# Patient Record
Sex: Female | Born: 1970 | Race: Black or African American | Hispanic: No | Marital: Single
Health system: Southern US, Community
[De-identification: ages and names within clinical notes are randomized; demographics above are authoritative.]

## PROBLEM LIST (undated history)

## (undated) DIAGNOSIS — M1990 Unspecified osteoarthritis, unspecified site: Secondary | ICD-10-CM

## (undated) DIAGNOSIS — I10 Essential (primary) hypertension: Secondary | ICD-10-CM

---

## 1999-10-07 ENCOUNTER — Other Ambulatory Visit: Admission: RE | Admit: 1999-10-07 | Discharge: 1999-10-07 | Payer: Self-pay | Admitting: Family Medicine

## 2008-10-28 ENCOUNTER — Emergency Department (HOSPITAL_COMMUNITY): Admission: EM | Admit: 2008-10-28 | Discharge: 2008-10-28 | Payer: Self-pay | Admitting: Family Medicine

## 2011-01-23 LAB — URINE CULTURE

## 2011-01-23 LAB — POCT URINALYSIS DIP (DEVICE)
Protein, ur: >=300 mg/dL — AB
Specific Gravity, Urine: 1.01 (ref 1.005–1.030)
Urobilinogen, UA: 0.2 mg/dL (ref 0.0–1.0)

## 2011-01-23 LAB — POCT PREGNANCY, URINE: Preg Test, Ur: NEGATIVE

## 2011-02-23 ENCOUNTER — Ambulatory Visit
Admission: RE | Admit: 2011-02-23 | Discharge: 2011-02-23 | Disposition: A | Discharge status: Home / Self Care | Payer: Private Health Insurance - Indemnity | Source: Ambulatory Visit | Attending: Family Medicine | Admitting: Family Medicine

## 2011-02-23 ENCOUNTER — Other Ambulatory Visit: Payer: Self-pay | Admitting: Family Medicine

## 2011-02-23 MED ORDER — IOHEXOL 300 MG/ML  SOLN
100.0000 mL | Freq: Once | INTRAMUSCULAR | Status: AC | PRN
  Administered 2011-02-23: 100 mL via INTRAVENOUS

## 2011-09-07 ENCOUNTER — Other Ambulatory Visit (HOSPITAL_COMMUNITY)
Admission: RE | Admit: 2011-09-07 | Discharge: 2011-09-07 | Disposition: A | Discharge status: Home / Self Care | Payer: Private Health Insurance - Indemnity | Source: Ambulatory Visit | Attending: Family Medicine | Admitting: Family Medicine

## 2011-09-07 DIAGNOSIS — R87810 Cervical high risk human papillomavirus (HPV) DNA test positive: Secondary | ICD-10-CM | POA: Insufficient documentation

## 2011-09-07 DIAGNOSIS — Z124 Encounter for screening for malignant neoplasm of cervix: Secondary | ICD-10-CM | POA: Insufficient documentation

## 2012-10-01 IMAGING — CT CT ABD-PELV W/ CM
2 of 5 series · 17 of 46 positions shown, 19 images · IV contrast (30CC OMNI 300 & [ID] OMNI 300)
Comparison: None.

CLINICAL DATA: Mid and upper abdominal pain for 4 days.  Question
appendicitis.  History of hernia repair as an infant

CT ABDOMEN AND PELVIS WITH CONTRAST
TECHNIQUE: Multidetector CT imaging of the abdomen and pelvis was
performed following the standard protocol during bolus
administration of intravenous contrast.
Contrast: 100 ml Lmnipaque-4YY intravenously.

[Series 2: abdomen w/ · axial · 0.67mm/px · z∈[-411,-56]mm · 14 of 77 slices shown, 16 images]
[im 4/77  soft-tissue]
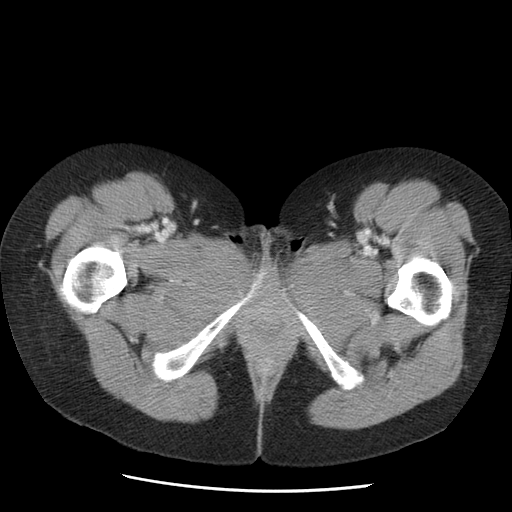
[im 4/77  bone]
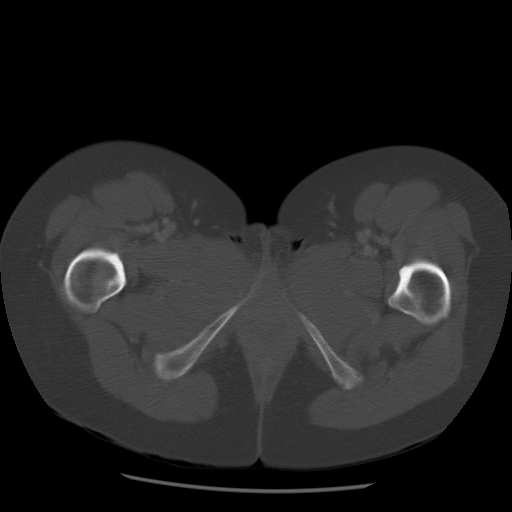
[im 12/77  soft-tissue]
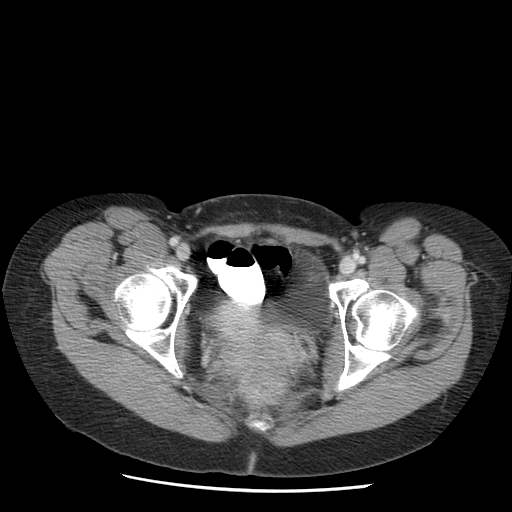
[im 16/77  soft-tissue]
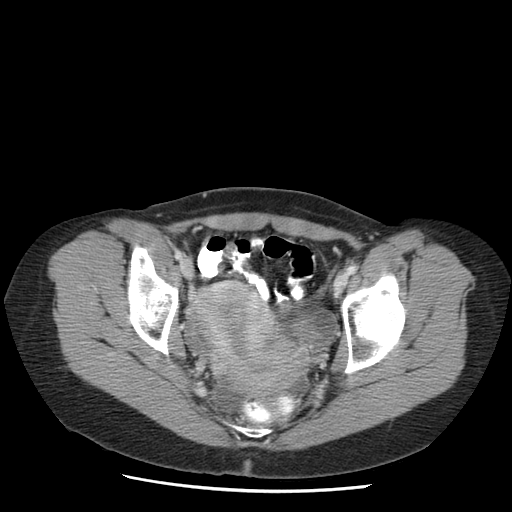
[im 20/77  soft-tissue]
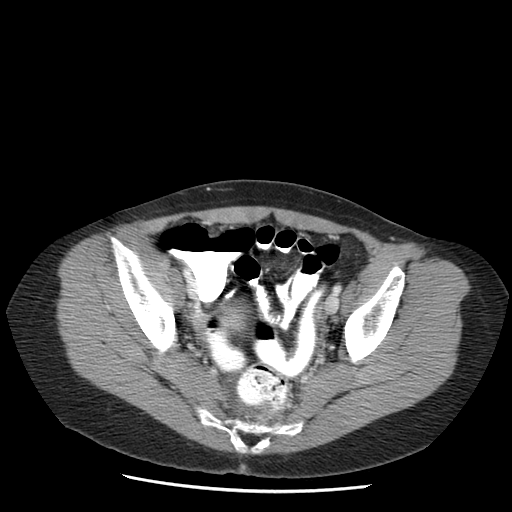
[im 27/77  soft-tissue]
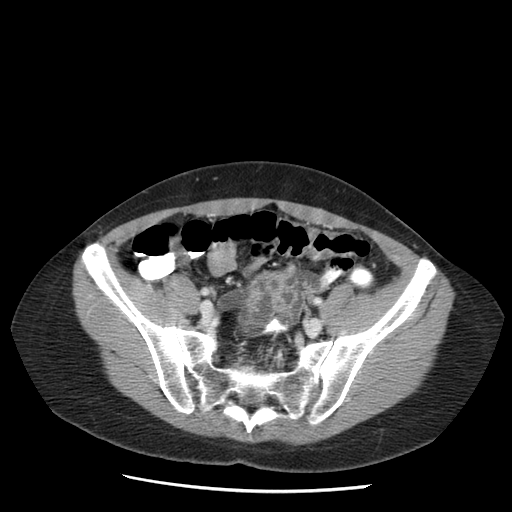
[im 31/77  soft-tissue]
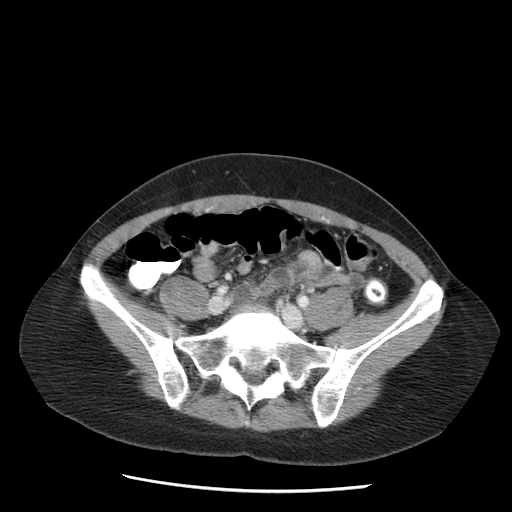
[im 35/77  soft-tissue]
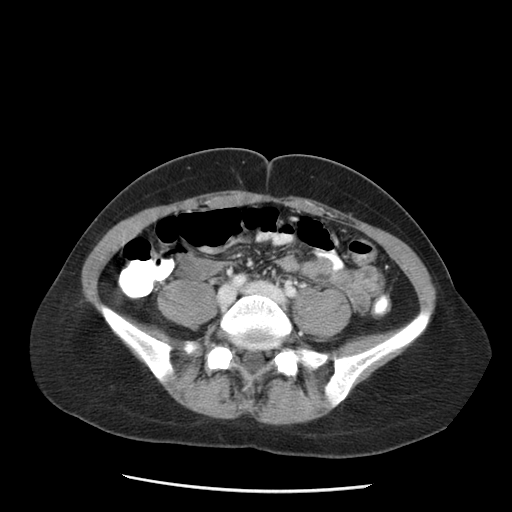
[im 42/77  soft-tissue]
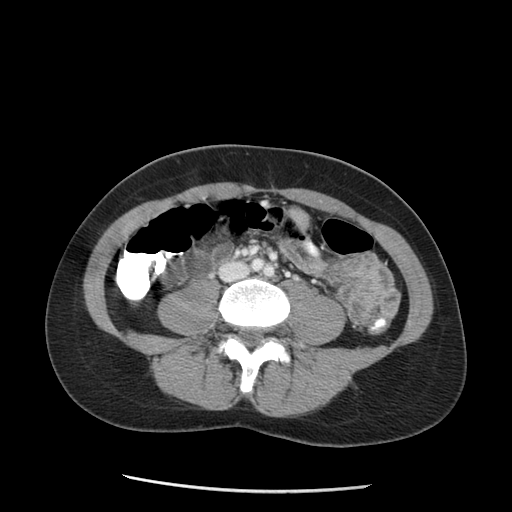
[im 46/77  soft-tissue]
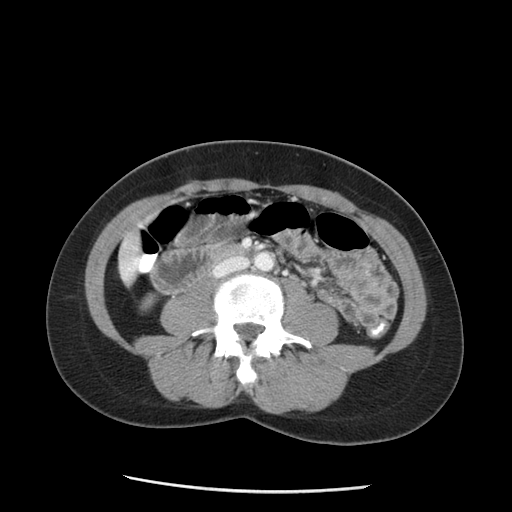
[im 46/77  bone]
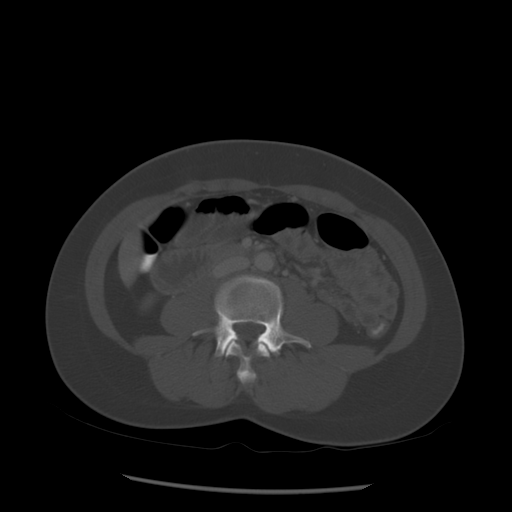
[im 50/77  soft-tissue]
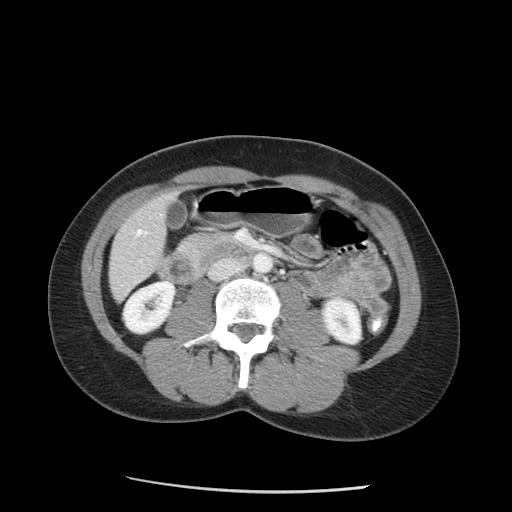
[im 58/77  soft-tissue]
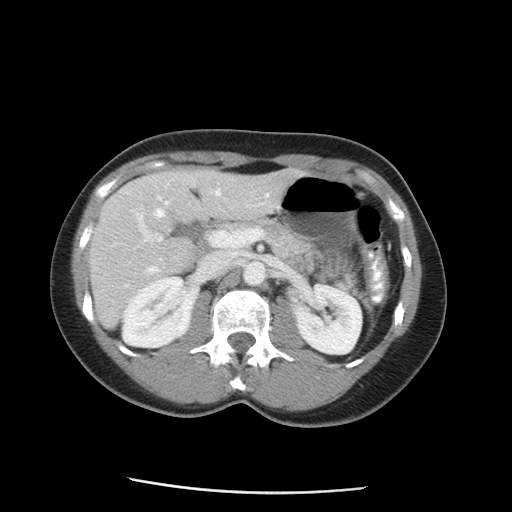
[im 61/77  soft-tissue]
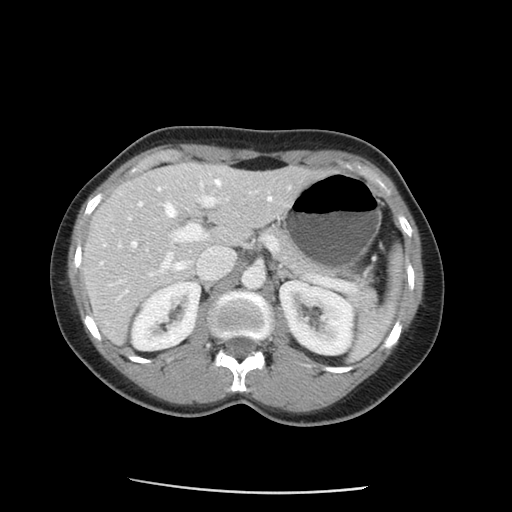
[im 65/77  soft-tissue]
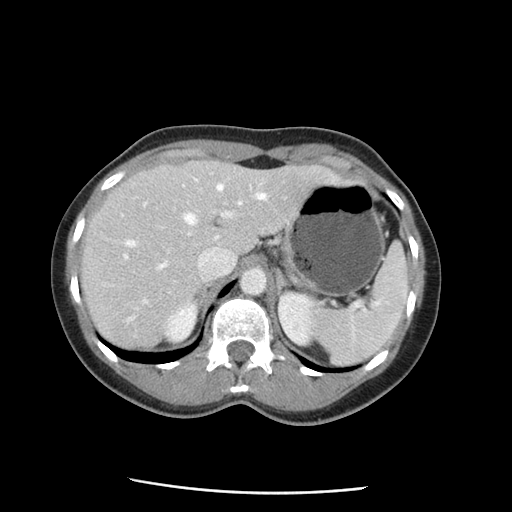
[im 73/77  soft-tissue]
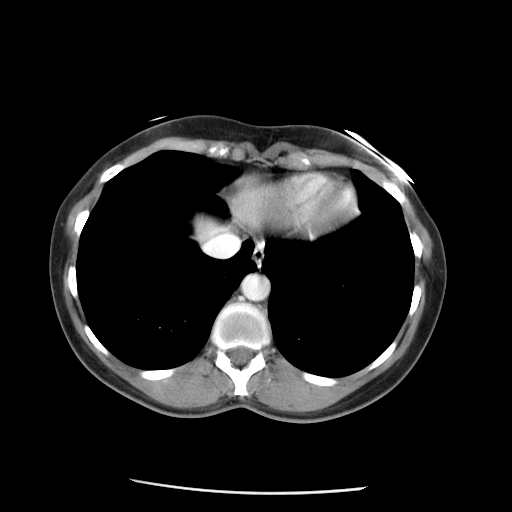

[Series 400: coronals · coronal · 0.78mm/px · 3 of 101 slices shown]
[im 34/101  soft-tissue]
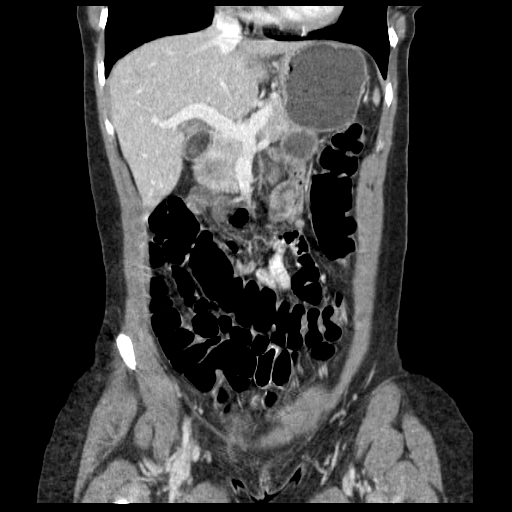
[im 45/101  soft-tissue]
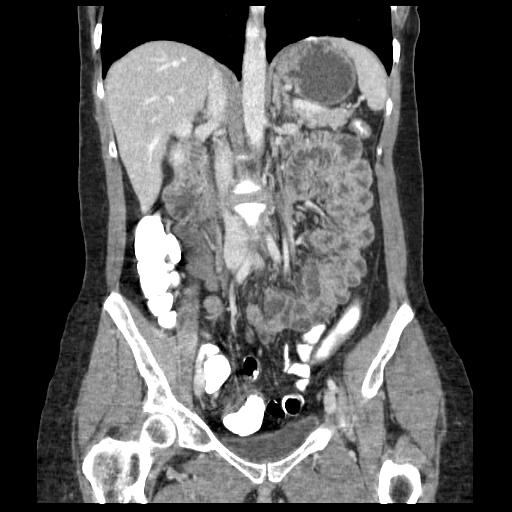
[im 56/101  soft-tissue]
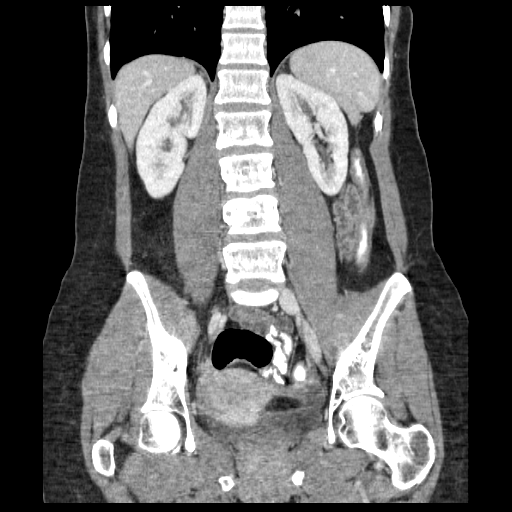

[17 of 46 positions shown; findings below may reference images not displayed]

FINDINGS: The lung bases are clear.  There is no pleural effusion.
There is a 4 mm low density lesion inferiorly in the right hepatic
lobe on image 25.  This is not as well seen post contrast and may
be enhancing, suggesting a possible hemangioma.  The liver
otherwise appears normal.  The spleen, gallbladder, pancreas,
adrenal glands and kidneys appear normal.

The bowel gas pattern is normal.  The appendix appears normal.
There is no evidence of bowel obstruction.

There is a moderate amount of free pelvic fluid. There is a 1.5 cm
fatty lesion in the right adnexa on image 60, likely representing a
small incidental dermoid.  There is no other adnexal abnormality.
The uterus and urinary bladder appear unremarkable.  No osseous
abnormalities are seen.
IMPRESSION: 1.  No evidence of acute appendicitis, pelvic abscess or bowel
obstruction.
2.  Moderate free pelvic fluid is nonspecific but suggestive of a
possible ruptured ovarian cyst.  Correlate clinically.  There is a
probable small incidental right ovarian dermoid.
3.  Small liver lesion, likely incidental.

## 2013-06-10 ENCOUNTER — Other Ambulatory Visit: Payer: Self-pay | Admitting: Family Medicine

## 2013-06-10 ENCOUNTER — Other Ambulatory Visit (HOSPITAL_COMMUNITY)
Admission: RE | Admit: 2013-06-10 | Discharge: 2013-06-10 | Disposition: A | Discharge status: Home / Self Care | Payer: Private Health Insurance - Indemnity | Source: Ambulatory Visit | Attending: Family Medicine | Admitting: Family Medicine

## 2013-06-10 DIAGNOSIS — Z124 Encounter for screening for malignant neoplasm of cervix: Secondary | ICD-10-CM | POA: Insufficient documentation

## 2013-06-10 DIAGNOSIS — Z113 Encounter for screening for infections with a predominantly sexual mode of transmission: Secondary | ICD-10-CM | POA: Insufficient documentation

## 2013-06-10 DIAGNOSIS — Z1151 Encounter for screening for human papillomavirus (HPV): Secondary | ICD-10-CM | POA: Insufficient documentation

## 2017-12-18 ENCOUNTER — Other Ambulatory Visit: Payer: Self-pay

## 2017-12-18 ENCOUNTER — Emergency Department (HOSPITAL_COMMUNITY)
Admission: EM | Admit: 2017-12-18 | Discharge: 2017-12-19 | Disposition: A | Discharge status: Home / Self Care | Payer: 59 | Attending: Emergency Medicine | Admitting: Emergency Medicine

## 2017-12-18 ENCOUNTER — Encounter (HOSPITAL_COMMUNITY): Payer: Self-pay | Admitting: Emergency Medicine

## 2017-12-18 DIAGNOSIS — I10 Essential (primary) hypertension: Secondary | ICD-10-CM | POA: Diagnosis not present

## 2017-12-18 DIAGNOSIS — M79605 Pain in left leg: Secondary | ICD-10-CM | POA: Diagnosis not present

## 2017-12-18 DIAGNOSIS — M5410 Radiculopathy, site unspecified: Secondary | ICD-10-CM | POA: Insufficient documentation

## 2017-12-18 DIAGNOSIS — R030 Elevated blood-pressure reading, without diagnosis of hypertension: Secondary | ICD-10-CM

## 2017-12-18 DIAGNOSIS — M25562 Pain in left knee: Secondary | ICD-10-CM | POA: Diagnosis not present

## 2017-12-18 DIAGNOSIS — M792 Neuralgia and neuritis, unspecified: Secondary | ICD-10-CM

## 2017-12-18 HISTORY — DX: Essential (primary) hypertension: I10

## 2017-12-18 HISTORY — DX: Unspecified osteoarthritis, unspecified site: M19.90

## 2017-12-18 NOTE — ED Triage Notes (Signed)
PT reports that her blood pressure has been up despite taking her medication.  She also complains of joint pain (both knees and shoulder.)

## 2017-12-19 ENCOUNTER — Ambulatory Visit (HOSPITAL_BASED_OUTPATIENT_CLINIC_OR_DEPARTMENT_OTHER)
Admission: RE | Admit: 2017-12-19 | Discharge: 2017-12-19 | Disposition: A | Discharge status: Home / Self Care | Payer: 59 | Source: Ambulatory Visit | Attending: Emergency Medicine | Admitting: Emergency Medicine

## 2017-12-19 ENCOUNTER — Ambulatory Visit (HOSPITAL_COMMUNITY): Admission: RE | Admit: 2017-12-19 | Payer: 59 | Source: Ambulatory Visit

## 2017-12-19 DIAGNOSIS — M79609 Pain in unspecified limb: Secondary | ICD-10-CM | POA: Diagnosis not present

## 2017-12-19 DIAGNOSIS — M79605 Pain in left leg: Secondary | ICD-10-CM | POA: Insufficient documentation

## 2017-12-19 NOTE — ED Provider Notes (Signed)
[facility] Provider Note   CSN: [id number] Arrival date & time: 12/18/17  2203     History   Chief Complaint Chief Complaint  Patient presents with  . Hypertension  . Joint Pain    HPI Felicia Hickman is a 47 y.o. female.  Patient presents to the ED with a chief complaint of hypertension.  She states that she was traveling and forgot to take her BP meds.  States that it felt like her BP was high and when she took it was significantly elevated when compared to her normal.  She denies any headache, dizziness, chest pain, or shortness of breath.  She reports having some right upper shoulder pain which radiates down the right arm.  This is made worse with palpation and movement. She also complaint of left knee pain and swelling x 3 weeks, but believes this was wors[place]    The history is provided by the patient. No language interpreter was used.    Past Medical History:  Diagnosis Date  . Arthritis   . Hypertension     There are no active problems to display for this patient.   History reviewed. No pertinent surgical history.  OB History    No data available       Home Medications    Prior to Admission medications   Not on File    Family History No family history on file.  Social History Social History   Tobacco Use  . Smoking status: Not on file  Substance Use Topics  . Alcohol use: Not on file  . Drug use: Not on file     Allergies   Flonase [fluticasone propionate]   Review of Systems Review of Systems  All other systems reviewed and are negative.    Physical Exam Updated Vital Signs BP 118/81   Pulse 69   Temp 98.7 F (37.1 C) (Oral)   Resp 18   SpO2 100%   Physical Exam  Constitutional: She is oriented to person, place, and time. She appears well-developed and well-nourished.  HENT:  Head: Normocephalic and atraumatic.  Right Ear: External ear normal.  Left  Ear: External ear normal.  Eyes: Conjunctivae and EOM are normal. Pupils are equal, round, and reactive to light.  Neck: Normal range of motion. Neck supple.  No pain with neck flexion, no meningismus  Cardiovascular: Normal rate, regular rhythm and normal heart sounds. Exam reveals no gallop and no friction rub.  No murmur heard. Pulmonary/Chest: Effort normal and breath sounds normal. No respiratory distress. She has no wheezes. She has no rales. She exhibits no tenderness.  Abdominal: Soft. Bowel sounds are normal. She exhibits no distension and no mass. There is no tenderness. There is no rebound and no guarding.  Musculoskeletal: Normal range of motion. She exhibits no edema or tenderness.  Minimal left calf tenderness Mild swelling of the left knee, ROM and strength intact, no palpable effusion, no cellulitic changes or evidence of abscess  Neurological: She is alert and oriented to person, place, and time. She has normal reflexes.  CN 3-12 intact, normal finger to nose, no pronator drift, sensation and strength intact bilaterally.  Skin: Skin is warm and dry.  Psychiatric: She has a normal mood and affect. Her behavior is normal. Judgment and thought content normal.  Nursing note and vitals reviewed.    ED Treatments / Results  Labs (all labs ordered are listed, but only abnormal results are  displayed) Labs Reviewed - No data to display  EKG  EKG Interpretation None       Radiology No results found.  Procedures Procedures (including critical care time)  Medications Ordered in ED Medications - No data to display   Initial Impression / Assessment and Plan / ED Course  I have reviewed the triage vital signs and the nursing notes.  Pertinent labs & imaging results that were available during my care of the patient were reviewed by me and considered in my medical decision making (see chart for details).     Patient with hypertension.  Has not been 100% compliant with  meds and became concerned about elevated BP.  BP has trended down nicely in the ED as patient has relaxed.  She has no CP, HA, or weakness.  She does have some right shoulder and upper trap pain that radiates to the RUE, but this is reproducible.  She has good strength and sensation and is neurovascularly intact.  She does have some slight left calf tenderness and left knee pain.  I suspect that the pain in her knee is OA as she has a hx of the same in the right and it has been present for 3 weeks,  However, due to the fact that it worsened after the plane ride and she has some calf tenderness will order outpatient doppler DVT study.  Patient will return for this in the morning.  Will give knee sleeve for comfort.  Doubt utility of x-ray in evaluating the knee pain x 3 weeks.  Recommend PCP follow-up.  Final Clinical Impressions(s) / ED Diagnoses   Final diagnoses:  Elevated blood pressure reading  Acute pain of left knee  Radicular pain in right arm    ED Discharge Orders        Ordered    [facility]     12/19/17 0523       Roxy Horseman, PA-C 12/19/17 0525    Dione Booze, MD 12/19/17 [phone]

## 2017-12-19 NOTE — Discharge Instructions (Addendum)
Please take blood pressure medications as directed.  Return for ultrasound as directed.  Return to the ER for chest pain, shortness of breath, or any other symptoms you find concerning.   Please follow-up with your doctor regarding your knee and shoulder pain.

## 2017-12-19 NOTE — ED Notes (Signed)
Ortho in room applying knee sleeve

## 2017-12-19 NOTE — Progress Notes (Signed)
Left lower extremity venous duplex has been completed. Negative for DVT.  12/19/17 3:43 PM Olen CordialGreg Icelyn Navarrete RVT

## 2018-02-05 ENCOUNTER — Other Ambulatory Visit (HOSPITAL_COMMUNITY)
Admission: RE | Admit: 2018-02-05 | Discharge: 2018-02-05 | Disposition: A | Discharge status: Home / Self Care | Payer: 59 | Source: Ambulatory Visit | Attending: Family Medicine | Admitting: Family Medicine

## 2018-02-05 ENCOUNTER — Other Ambulatory Visit: Payer: Self-pay | Admitting: Family Medicine

## 2018-02-05 DIAGNOSIS — Z01411 Encounter for gynecological examination (general) (routine) with abnormal findings: Secondary | ICD-10-CM | POA: Insufficient documentation

## 2018-02-08 LAB — CYTOLOGY - PAP
Diagnosis: NEGATIVE
HPV (WINDOPATH): NOT DETECTED

## 2018-05-01 ENCOUNTER — Other Ambulatory Visit (HOSPITAL_BASED_OUTPATIENT_CLINIC_OR_DEPARTMENT_OTHER): Payer: Self-pay

## 2018-05-01 DIAGNOSIS — R5383 Other fatigue: Secondary | ICD-10-CM

## 2018-05-01 DIAGNOSIS — G4710 Hypersomnia, unspecified: Secondary | ICD-10-CM

## 2018-06-03 ENCOUNTER — Ambulatory Visit (HOSPITAL_BASED_OUTPATIENT_CLINIC_OR_DEPARTMENT_OTHER): Payer: 59 | Attending: Internal Medicine | Admitting: Internal Medicine

## 2018-06-03 VITALS — Ht 63.5 in | Wt 150.0 lb

## 2018-06-03 DIAGNOSIS — R5383 Other fatigue: Secondary | ICD-10-CM

## 2018-06-03 DIAGNOSIS — G4710 Hypersomnia, unspecified: Secondary | ICD-10-CM | POA: Diagnosis not present

## 2018-06-04 ENCOUNTER — Ambulatory Visit (HOSPITAL_BASED_OUTPATIENT_CLINIC_OR_DEPARTMENT_OTHER): Payer: Self-pay | Attending: Internal Medicine | Admitting: Internal Medicine

## 2018-06-04 DIAGNOSIS — G4719 Other hypersomnia: Secondary | ICD-10-CM

## 2018-06-05 ENCOUNTER — Other Ambulatory Visit (HOSPITAL_BASED_OUTPATIENT_CLINIC_OR_DEPARTMENT_OTHER): Payer: Self-pay

## 2018-06-05 DIAGNOSIS — R5383 Other fatigue: Secondary | ICD-10-CM

## 2018-06-05 DIAGNOSIS — G4710 Hypersomnia, unspecified: Secondary | ICD-10-CM

## 2018-06-15 NOTE — Procedures (Signed)
   NAME: Felicia Hickman DATE OF BIRTH:  [birth year] MEDICAL RECORD NUMBER [id number]  LOCATION: [facility]  PHYSICIAN: Deretha Emory  DATE OF STUDY: 06/03/2018  SLEEP STUDY TYPE: Nocturnal Polysomnogram               REFERRING PHYSICIAN: Deretha Emory, MD  INDICATION FOR STUDY: excessive daytime sleepiness  EPWORTH SLEEPINESS SCORE:  7 FOSQ 10: 10.6 HEIGHT: 5' 3.5" (161.3 cm)  WEIGHT: 150 lb (68 kg)    Body mass index is 26.15 kg/m.  NECK SIZE: 13.5 in.  MEDICATIONS  Patient self administered medications include: N/A. Medications administered during study include No sleep medicine administered.Marland Kitchen   SLEEP STUDY TECHNIQUE  A multi-channel overnight Polysomnography study was performed. The channels recorded and monitored were central and occipital EEG, electrooculogram (EOG), submentalis EMG (chin), nasal and oral airflow, thoracic and abdominal wall motion, anterior tibialis EMG, snore microphone, electrocardiogram, and a pulse oximetry.   TECHNICAL COMMENTS  Comments added by Technician: PT HAD ONE REST ROOM VISTED. Patient had difficulty initiating sleep. Patient was restless all through the night. Comments added by Scorer: N/A   SLEEP ARCHITECTURE  The study was initiated at 10:53:32 PM and terminated at 5:58:55 AM. The total recorded time was 425.4 minutes. EEG confirmed total sleep time was 240 minutes yielding a sleep efficiency of 56.4%%. Sleep onset after lights out was 70.3 minutes with a REM latency of 141.5 minutes. The patient spent 4.6%% of the night in stage N1 sleep, 53.8%% in stage N2 sleep, 24.6%% in stage N3 and 17.1% in REM. Wake after sleep onset (WASO) was 115.1 minutes. The Arousal Index was 7.8/hour.   RESPIRATORY PARAMETERS  There were a total of 2 respiratory disturbances out of which 1 were apneas ( 0 obstructive, 0 mixed, 1 central) and 1 hypopneas. The apnea/hypopnea index (AHI) was 0.5 events/hour. The central sleep apnea  index was 0.3 events/hour. The REM AHI was 1.5 events/hour and NREM AHI was 0.3 events/hour. The supine AHI was 0.8 events/hour and the non supine AHI was 0.4 supine during 31.36% of sleep. Respiratory disturbances were associated with oxygen desaturation down to a nadir of 93.0% during sleep. The mean oxygen saturation during the study was 96.3%. The cumulative time under 88% oxygen saturation was 5.5 minutes.   LEG MOVEMENT DATA  The total leg movements were 0 with a resulting leg movement index of 0.0/hr . Associated arousal with leg movement index was 0.0/hr.   CARDIAC DATA  The underlying cardiac rhythm was most consistent with sinus rhythm. Mean heart rate during sleep was 61.3 bpm. Additional rhythm abnormalities include None.   IMPRESSIONS  Decreased sleep efficiency No sleep apnea or leg movements causing arousals.   DIAGNOSIS  Excessive daytime sleepiness   RECOMMENDATIONS  May proceed with MSLT  Deretha Emory Sleep specialist, American Board of Internal Medicine  ELECTRONICALLY SIGNED ON:  06/15/2018, 7:57 PM [facility] PH: [phone]   FX: [phone] ACCREDITED BY THE AMERICAN ACADEMY OF SLEEP MEDICINE

## 2018-06-18 NOTE — Procedures (Signed)
    NAME: Felicia Hickman DATE OF BIRTH:  Sep 20, 1971 MEDICAL RECORD NUMBER [id number]  LOCATION: [facility]  PHYSICIAN: Deretha Emory  DATE OF STUDY: 06/04/2018  SLEEP STUDY TYPE: Multiple Sleep Latency Test               REFERRING PHYSICIAN: Deretha Emory, MD  INDICATION FOR STUDY: excessive daytime sleepiness in patient with normal PSG.  EPWORTH SLEEPINESS SCORE:   see PSG  HEIGHT:    WEIGHT:      There is no height or weight on file to calculate BMI.   MEDICATIONS  Patient self administered medications include: N/A. Medications administered during study include No sleep medicine administered.Marland Kitchen   SLEEP STUDY TECHNIQUE  A multiple sleep latency test was performed. The channels recorded and monitored were central and occipital EEG, electrooculogram (EOG), submentalis EMG (chin), and electrocardiogram.   TECHNICAL COMMENTS  Comments added by Technician: NONE Comments added by Scorer: N/A   IMPRESSIONS  Total number of naps attempted: 5 . Total number of naps with sleep attained: 3 No sleep onset REMs present. This study does not suggest narcolepsy Sleep onset latency 12:29 minutes. This test does not suggest pathologic sleepiness.  DIAGNOSIS  Normal study  RECOMMENDATIONS  Return for follow up to evaluate other causes of excessive daytime sleepiness.  Deretha Emory Sleep specialist, American Board of Internal Medicine  ELECTRONICALLY SIGNED ON:  06/18/2018, 9:30 PM [facility] PH: [phone]   FX: [phone] ACCREDITED BY THE AMERICAN ACADEMY OF SLEEP MEDICINE

## 2020-05-28 DIAGNOSIS — Z1231 Encounter for screening mammogram for malignant neoplasm of breast: Secondary | ICD-10-CM | POA: Diagnosis not present

## 2020-09-16 ENCOUNTER — Ambulatory Visit: Payer: Self-pay | Attending: Internal Medicine

## 2020-09-16 DIAGNOSIS — Z23 Encounter for immunization: Secondary | ICD-10-CM

## 2020-09-16 NOTE — Progress Notes (Signed)
   Covid-19 Vaccination Clinic  Name:  KYLEI PURINGTON    MRN: [id number] DOB: 1971-01-16  09/16/2020  Ms. Afshar was observed post Covid-19 immunization for 15 minutes without incident. She was provided with Vaccine Information Sheet and instruction to access the V-Safe system.   Ms. Haberl was instructed to call 911 with any severe reactions post vaccine: Marland Kitchen Difficulty breathing  . Swelling of face and throat  . A fast heartbeat  . A bad rash all over body  . Dizziness and weakness   Immunizations Administered    Name Date Dose VIS Date Route   Pfizer COVID-19 Vaccine 09/16/2020  5:39 PM 0.3 mL 07/28/2020 Intramuscular   Manufacturer: [employer], [employer]   Lot: [id number]   NDC: [address]-1

## 2021-04-22 ENCOUNTER — Ambulatory Visit: Payer: Self-pay | Attending: Internal Medicine

## 2021-04-22 ENCOUNTER — Other Ambulatory Visit (HOSPITAL_BASED_OUTPATIENT_CLINIC_OR_DEPARTMENT_OTHER): Payer: Self-pay

## 2021-04-22 ENCOUNTER — Other Ambulatory Visit: Payer: Self-pay

## 2021-04-22 DIAGNOSIS — Z23 Encounter for immunization: Secondary | ICD-10-CM

## 2021-04-22 MED ORDER — PFIZER-BIONT COVID-19 VAC-TRIS 30 MCG/0.3ML IM SUSP
INTRAMUSCULAR | 0 refills | Status: AC
  Filled 2021-04-22: qty 0.3, 1d supply, fill #0

## 2021-04-22 NOTE — Progress Notes (Signed)
   Covid-19 Vaccination Clinic  Name:  Felicia Hickman    MRN: [id number] DOB: 1971-03-12  04/22/2021  [name] was observed post Covid-19 immunization for 15 minutes without incident. She was provided with Vaccine Information Sheet and instruction to access the V-Safe system.   [name] was instructed to call 911 with any severe reactions post vaccine: Difficulty breathing  Swelling of face and throat  A fast heartbeat  A bad rash all over body  Dizziness and weakness   Immunizations Administered     Name Date Dose VIS Date Route   PFIZER Comrnaty(Gray TOP) Covid-19 Vaccine 04/22/2021 11:50 AM 0.3 mL 09/16/2020 Intramuscular   Manufacturer: [employer], [employer]   Lot: [id number]   NDC: [phone]

## 2021-06-03 DIAGNOSIS — Z1231 Encounter for screening mammogram for malignant neoplasm of breast: Secondary | ICD-10-CM | POA: Diagnosis not present

## 2021-08-12 DIAGNOSIS — I10 Essential (primary) hypertension: Secondary | ICD-10-CM | POA: Diagnosis not present

## 2021-08-12 DIAGNOSIS — R5383 Other fatigue: Secondary | ICD-10-CM | POA: Diagnosis not present

## 2021-12-01 ENCOUNTER — Ambulatory Visit: Payer: Self-pay | Attending: Internal Medicine

## 2021-12-01 ENCOUNTER — Other Ambulatory Visit (HOSPITAL_BASED_OUTPATIENT_CLINIC_OR_DEPARTMENT_OTHER): Payer: Self-pay

## 2021-12-01 DIAGNOSIS — Z23 Encounter for immunization: Secondary | ICD-10-CM

## 2021-12-01 MED ORDER — PFIZER COVID-19 VAC BIVALENT 30 MCG/0.3ML IM SUSP
INTRAMUSCULAR | 0 refills | Status: AC
  Filled 2021-12-01: qty 0.3, 1d supply, fill #0

## 2021-12-01 NOTE — Progress Notes (Signed)
° °  Covid-19 Vaccination Clinic  Name:  Felicia Hickman    MRN: [id number] DOB: [birth year]  12/01/2021  Felicia Hickman was observed post Covid-19 immunization for 15 minutes without incident. She was provided with Vaccine Information Sheet and instruction to access the V-Safe system.   Felicia Hickman was instructed to call 911 with any severe reactions post vaccine: Difficulty breathing  Swelling of face and throat  A fast heartbeat  A bad rash all over body  Dizziness and weakness   Immunizations Administered     Name Date Dose VIS Date Route   Pfizer Covid-19 Vaccine Bivalent Booster 12/01/2021 11:56 AM 0.3 mL 06/08/2021 Intramuscular   Manufacturer: [place]   Lot: [id number]   [place]: [phone]

## 2022-06-09 DIAGNOSIS — Z1231 Encounter for screening mammogram for malignant neoplasm of breast: Secondary | ICD-10-CM | POA: Diagnosis not present

## 2022-08-24 DIAGNOSIS — I10 Essential (primary) hypertension: Secondary | ICD-10-CM | POA: Diagnosis not present

## 2022-08-24 DIAGNOSIS — R5382 Chronic fatigue, unspecified: Secondary | ICD-10-CM | POA: Diagnosis not present

## 2022-08-24 DIAGNOSIS — R638 Other symptoms and signs concerning food and fluid intake: Secondary | ICD-10-CM | POA: Diagnosis not present

## 2022-08-24 DIAGNOSIS — E611 Iron deficiency: Secondary | ICD-10-CM | POA: Diagnosis not present

## 2022-10-20 ENCOUNTER — Other Ambulatory Visit (HOSPITAL_BASED_OUTPATIENT_CLINIC_OR_DEPARTMENT_OTHER): Payer: Self-pay

## 2022-10-20 MED ORDER — INFLUENZA VAC SPLIT QUAD 0.5 ML IM SUSY
PREFILLED_SYRINGE | INTRAMUSCULAR | 0 refills | Status: AC
  Filled 2022-10-20: qty 0.5, 1d supply, fill #0

## 2022-10-20 MED ORDER — COMIRNATY 30 MCG/0.3ML IM SUSY
PREFILLED_SYRINGE | INTRAMUSCULAR | 0 refills | Status: AC
  Filled 2022-10-20: qty 0.3, 1d supply, fill #0

## 2023-09-04 DIAGNOSIS — Z1231 Encounter for screening mammogram for malignant neoplasm of breast: Secondary | ICD-10-CM | POA: Diagnosis not present

## 2024-04-29 DIAGNOSIS — I10 Essential (primary) hypertension: Secondary | ICD-10-CM | POA: Diagnosis not present

## 2024-04-29 DIAGNOSIS — R5382 Chronic fatigue, unspecified: Secondary | ICD-10-CM | POA: Diagnosis not present

## 2024-04-29 DIAGNOSIS — E611 Iron deficiency: Secondary | ICD-10-CM | POA: Diagnosis not present

## 2024-04-29 DIAGNOSIS — N644 Mastodynia: Secondary | ICD-10-CM | POA: Diagnosis not present

## 2024-04-29 DIAGNOSIS — E559 Vitamin D deficiency, unspecified: Secondary | ICD-10-CM | POA: Diagnosis not present

## 2024-04-30 ENCOUNTER — Other Ambulatory Visit: Payer: Self-pay | Admitting: Physician Assistant

## 2024-04-30 DIAGNOSIS — N644 Mastodynia: Secondary | ICD-10-CM

## 2024-05-08 ENCOUNTER — Ambulatory Visit
Admission: RE | Admit: 2024-05-08 | Discharge: 2024-05-08 | Disposition: A | Discharge status: Home / Self Care | Payer: Self-pay | Source: Ambulatory Visit | Attending: Physician Assistant | Admitting: Physician Assistant

## 2024-05-08 DIAGNOSIS — N644 Mastodynia: Secondary | ICD-10-CM

## 2024-05-08 DIAGNOSIS — R92321 Mammographic fibroglandular density, right breast: Secondary | ICD-10-CM | POA: Diagnosis not present

## 2024-10-07 DIAGNOSIS — Z1231 Encounter for screening mammogram for malignant neoplasm of breast: Secondary | ICD-10-CM | POA: Diagnosis not present
